# Patient Record
Sex: Male | Born: 1970
Health system: Southern US, Community
[De-identification: ages and names within clinical notes are randomized; demographics above are authoritative.]

---

## 2017-09-25 ENCOUNTER — Ambulatory Visit (INDEPENDENT_AMBULATORY_CARE_PROVIDER_SITE_OTHER): Payer: BLUE CROSS/BLUE SHIELD

## 2017-09-25 ENCOUNTER — Ambulatory Visit: Payer: BLUE CROSS/BLUE SHIELD | Admitting: Physician Assistant

## 2017-09-25 ENCOUNTER — Encounter: Payer: Self-pay | Admitting: Physician Assistant

## 2017-09-25 ENCOUNTER — Other Ambulatory Visit: Payer: Self-pay

## 2017-09-25 VITALS — BP 128/78 | HR 78 | Resp 16 | Ht 68.0 in | Wt 139.2 lb

## 2017-09-25 DIAGNOSIS — R0789 Other chest pain: Secondary | ICD-10-CM

## 2017-09-25 NOTE — Progress Notes (Signed)
09/25/2017 3:53 PM   DOB: 05/29/1971 / MRN: [id number]  SUBJECTIVE:  Raymond Reyes is a 47 y.o. male presenting for chest pressure.  He has had several episodes of this over the last few weeks and notes that the pressure is intermittent last roughly 1 minute and he describes it as a tightness.  Denies any shortness of breath, dizziness, diaphoresis, nausea, leg swelling, orthopnea.  He is a never smoker.  He does dip.  He drinks alcohol sparingly.  He was able to run 2 miles this morning and 20 minutes without any difficulty and states that this is normal for him.  Is lost 20 pounds in the last 6 months.  He and his wife are separated and he now has to care for his 2 young children every other week.  He works 65-75 hours a week and is about to quit his job in 2 weeks and plans to take some time away from working so he can reestablish his love for his children.  He sleeps well at night however tells me that at times he will wake up in a light sweat, he denies chest pain during these times, as well as diaphoresis, dizziness.  In the past he is also awoken with worry.  He has no personal history of heart disease, diabetes, hypertension.  He has no allergies on file.   He  has no past medical history on file.    He  reports that  has never smoked. His smokeless tobacco use includes chew. He reports that he drinks alcohol. He reports that he does not use drugs. He  has no sexual activity history on file. The patient  has no past surgical history on file.  His family history is not on file.  ROS  As per HPI otherwise negative.  The problem list and medications were reviewed and updated by myself where necessary and exist elsewhere in the encounter.   OBJECTIVE:  BP 128/78 (BP Location: Left Arm, Patient Position: Sitting, Cuff Size: Normal)   Pulse 78   Resp 16   Ht 5' 8"  (1.727 m)   Wt 139 lb 3.2 oz (63.1 kg)   SpO2 99%   BMI 21.17 kg/m   Physical Exam  Constitutional: He is oriented to  person, place, and time. He appears well-developed. He is active and cooperative.  Non-toxic appearance.  Eyes: EOM are normal. Pupils are equal, round, and reactive to light.  Cardiovascular: Normal rate, regular rhythm, S1 normal, S2 normal, normal heart sounds, intact distal pulses and normal pulses. Exam reveals no gallop and no friction rub.  No murmur heard. Pulmonary/Chest: Effort normal. No tachypnea. He has no rales.  Abdominal: He exhibits no distension.  Musculoskeletal: He exhibits no edema.  Neurological: He is alert and oriented to person, place, and time. He has normal strength and normal reflexes. He is not disoriented. No cranial nerve deficit or sensory deficit. He exhibits normal muscle tone. Coordination and gait normal.  Skin: Skin is warm and dry. He is not diaphoretic. No pallor.  Psychiatric: His behavior is normal.  Vitals reviewed.   No results found for this or any previous visit (from the past 72 hour(s)).  Dg Chest 2 View  Result Date: 09/25/2017 CLINICAL DATA:  Chest pressure.  Normal EKG. EXAM: CHEST  2 VIEW COMPARISON:  None. FINDINGS: The heart size and mediastinal contours are within normal limits. Both lungs are clear. The visualized skeletal structures are unremarkable. IMPRESSION: No active cardiopulmonary disease. Electronically  Signed   By: Staci Righter M.D.   On: 09/25/2017 15:04    ASSESSMENT AND PLAN:  Nicki was seen today for chest pain and weight loss.  Diagnoses and all orders for this visit:  Chest pressure: EKG and chest x-ray normal at this time.  He has no chest pain in the office.  I will calculate his risk for ASCVD after receiving his labs back.  Will refer appropriately.  We will see him back in about 1 month to discuss his progress. -     EKG 12-Lead -     Basic metabolic panel -     Hepatic function panel -     CBC -     Lipid panel -     TSH -     Hemoglobin A1c -     DG Chest 2 View; Future -     Cancel: Urinalysis Dipstick -      Urinalysis, dipstick only    The patient is advised to call or return to clinic if he does not see an improvement in symptoms, or to seek the care of the closest emergency department if he worsens with the above plan.   Philis Fendt, MHS, PA-C [facility] 09/25/2017 3:53 PM

## 2017-09-25 NOTE — Patient Instructions (Addendum)
It was a pleasure meeting you today, I'd like to see you in 1 month to check on your symptoms. Please call my office should any further problems arise.Thank you for allowing [place] to care for you.                                                                                                     Tereasa Coop, MS PA-C   IF you received an x-ray today, you will receive an invoice from [facility]. Please contact [facility] at [phone] with questions or concerns regarding your invoice.   IF you received labwork today, you will receive an invoice from [facility]. Please contact [facility] at [phone] with questions or concerns regarding your invoice.   Our billing staff will not be able to assist you with questions regarding bills from these companies.  You will be contacted with the lab results as soon as they are available. The fastest way to get your results is to activate your My Chart account. Instructions are located on the last page of this paperwork. If you have not heard from [place] regarding the results in 2 weeks, please contact this office.

## 2017-09-26 LAB — BASIC METABOLIC PANEL
BUN / CREAT RATIO: 12 (ref 9–20)
BUN: 11 mg/dL (ref 6–24)
CALCIUM: 9.9 mg/dL (ref 8.7–10.2)
CHLORIDE: 102 mmol/L (ref 96–106)
CO2: 24 mmol/L (ref 20–29)
Creatinine, Ser: 0.89 mg/dL (ref 0.76–1.27)
GFR calc Af Amer: 119 mL/min/{1.73_m2} (ref >59)
GFR calc non Af Amer: 103 mL/min/{1.73_m2} (ref >59)
GLUCOSE: 79 mg/dL (ref 65–99)
POTASSIUM: 4.1 mmol/L (ref 3.5–5.2)
Sodium: 143 mmol/L (ref 134–144)

## 2017-09-26 LAB — HEPATIC FUNCTION PANEL
ALT: 16 IU/L (ref 0–44)
AST: 15 IU/L (ref 0–40)
Albumin: 5.2 g/dL (ref 3.5–5.5)
Alkaline Phosphatase: 49 IU/L (ref 39–117)
Bilirubin Total: 0.4 mg/dL (ref 0.0–1.2)
Bilirubin, Direct: 0.12 mg/dL (ref 0.00–0.40)
TOTAL PROTEIN: 7.2 g/dL (ref 6.0–8.5)

## 2017-09-26 LAB — URINALYSIS, DIPSTICK ONLY
Bilirubin, UA: NEGATIVE
Glucose, UA: NEGATIVE
Ketones, UA: NEGATIVE
LEUKOCYTES UA: NEGATIVE
NITRITE UA: NEGATIVE
PROTEIN UA: NEGATIVE
RBC, UA: NEGATIVE
Specific Gravity, UA: 1.008 (ref 1.005–1.030)
Urobilinogen, Ur: 0.2 mg/dL (ref 0.2–1.0)
pH, UA: 6 (ref 5.0–7.5)

## 2017-09-26 LAB — LIPID PANEL
CHOL/HDL RATIO: 3.4 ratio (ref 0.0–5.0)
Cholesterol, Total: 192 mg/dL (ref 100–199)
HDL: 57 mg/dL (ref >39)
LDL CALC: 114 mg/dL — AB (ref 0–99)
TRIGLYCERIDES: 103 mg/dL (ref 0–149)
VLDL CHOLESTEROL CAL: 21 mg/dL (ref 5–40)

## 2017-09-26 LAB — CBC
HEMATOCRIT: 43.2 % (ref 37.5–51.0)
HEMOGLOBIN: 14.2 g/dL (ref 13.0–17.7)
MCH: 30.5 pg (ref 26.6–33.0)
MCHC: 32.9 g/dL (ref 31.5–35.7)
MCV: 93 fL (ref 79–97)
Platelets: 340 10*3/uL (ref 150–379)
RBC: 4.66 x10E6/uL (ref 4.14–5.80)
RDW: 14.2 % (ref 12.3–15.4)
WBC: 5.5 10*3/uL (ref 3.4–10.8)

## 2017-09-26 LAB — HEMOGLOBIN A1C
Est. average glucose Bld gHb Est-mCnc: 105 mg/dL
Hgb A1c MFr Bld: 5.3 % (ref 4.8–5.6)

## 2017-09-26 LAB — TSH: TSH: 1.88 u[IU]/mL (ref 0.450–4.500)

## 2017-10-25 ENCOUNTER — Other Ambulatory Visit: Payer: Self-pay

## 2017-10-25 ENCOUNTER — Encounter: Payer: Self-pay | Admitting: Physician Assistant

## 2017-10-25 ENCOUNTER — Ambulatory Visit: Payer: BLUE CROSS/BLUE SHIELD | Admitting: Physician Assistant

## 2017-10-25 VITALS — BP 113/65 | HR 60 | Temp 98.0°F | Resp 16 | Ht 68.0 in | Wt 140.4 lb

## 2017-10-25 DIAGNOSIS — Z3009 Encounter for other general counseling and advice on contraception: Secondary | ICD-10-CM | POA: Diagnosis not present

## 2017-10-25 DIAGNOSIS — R0789 Other chest pain: Secondary | ICD-10-CM

## 2017-10-25 NOTE — Progress Notes (Signed)
    10/25/2017 11:22 AM   DOB: 05-12-1971 / MRN: [id number]  SUBJECTIVE:  Raymond Reyes is a 47 y.o. male presenting for recheck of chest pressure.  His EKG and chest x-ray were negative in the office.  He has an exquisitely low risk of ASCVD.  He has made some changes from a stress standpoint and tells me that the chest pressure has now resolved.  He feels well and denies complaint in this regard.  He would like to see a urologist regarding the potential for vasectomy. He has No Known Allergies.   He  has no past medical history on file.    He  reports that  has never smoked. His smokeless tobacco use includes chew. He reports that he drinks alcohol. He reports that he does not use drugs. He  has no sexual activity history on file. The patient  has no past surgical history on file.  His family history is not on file.  Review of Systems  Constitutional: Negative for chills, diaphoresis and fever.  Eyes: Negative.   Respiratory: Negative for cough, hemoptysis, sputum production, shortness of breath and wheezing.   Cardiovascular: Negative for chest pain, orthopnea and leg swelling.  Gastrointestinal: Negative for abdominal pain, blood in stool, constipation, diarrhea, heartburn, melena, nausea and vomiting.  Genitourinary: Negative for dysuria, flank pain, frequency, hematuria and urgency.  Skin: Negative for rash.  Neurological: Negative for dizziness, sensory change, speech change, focal weakness and headaches.    The problem list and medications were reviewed and updated by myself where necessary and exist elsewhere in the encounter.   OBJECTIVE:  BP 113/65   Pulse 60   Temp 98 F (36.7 C) (Oral)   Resp 16   Ht 5' 8"  (1.727 m)   Wt 140 lb 6.4 oz (63.7 kg)   SpO2 100%   BMI 21.35 kg/m     The 10-year ASCVD risk score Mikey Bussing DC Jr., et al., 2013) is: 1.3%   Values used to calculate the score:     Age: 28 years     Sex: Male     Is Non-Hispanic African American: No  Diabetic: No     Tobacco smoker: No     Systolic Blood Pressure: 742 mmHg     Is BP treated: No     HDL Cholesterol: 57 mg/dL     Total Cholesterol: 192 mg/dL   Physical Exam  Constitutional: He appears well-developed. He is active and cooperative.  Non-toxic appearance.  Cardiovascular: Normal rate.  Pulmonary/Chest: Effort normal. No tachypnea.  Neurological: He is alert.  Skin: Skin is warm and dry. He is not diaphoretic. No pallor.  Vitals reviewed.   No results found for this or any previous visit (from the past 72 hour(s)).  No results found.  ASSESSMENT AND PLAN:  Raymond Reyes was seen today for anxiety and stress.  Diagnoses and all orders for this visit:  Chest pressure Comments: Resolved.  He can come back as needed for this.  Vasectomy evaluation -     Ambulatory referral to Urology    The patient is advised to call or return to clinic if he does not see an improvement in symptoms, or to seek the care of the closest emergency department if he worsens with the above plan.   Philis Fendt, MHS, PA-C [facility] 10/25/2017 11:22 AM

## 2017-10-25 NOTE — Patient Instructions (Signed)
     IF you received an x-ray today, you will receive an invoice from [facility]. Please contact [facility] at [phone] with questions or concerns regarding your invoice.   IF you received labwork today, you will receive an invoice from [facility]. Please contact [facility] at [phone] with questions or concerns regarding your invoice.   Our billing staff will not be able to assist you with questions regarding bills from these companies.  You will be contacted with the lab results as soon as they are available. The fastest way to get your results is to activate your My Chart account. Instructions are located on the last page of this paperwork. If you have not heard from us regarding the results in 2 weeks, please contact this office.     

## 2017-12-03 DIAGNOSIS — S86002D Unspecified injury of left Achilles tendon, subsequent encounter: Secondary | ICD-10-CM | POA: Diagnosis not present

## 2017-12-03 DIAGNOSIS — S86002A Unspecified injury of left Achilles tendon, initial encounter: Secondary | ICD-10-CM | POA: Diagnosis not present

## 2017-12-04 ENCOUNTER — Ambulatory Visit
Admission: RE | Admit: 2017-12-04 | Discharge: 2017-12-04 | Disposition: A | Discharge status: Home / Self Care | Payer: Self-pay | Source: Ambulatory Visit | Attending: Orthopedic Surgery | Admitting: Orthopedic Surgery

## 2017-12-04 ENCOUNTER — Other Ambulatory Visit: Payer: Self-pay | Admitting: Orthopedic Surgery

## 2017-12-04 DIAGNOSIS — M7661 Achilles tendinitis, right leg: Secondary | ICD-10-CM | POA: Diagnosis not present

## 2017-12-04 DIAGNOSIS — M7662 Achilles tendinitis, left leg: Secondary | ICD-10-CM

## 2017-12-16 DIAGNOSIS — S86012D Strain of left Achilles tendon, subsequent encounter: Secondary | ICD-10-CM | POA: Diagnosis not present

## 2017-12-16 DIAGNOSIS — S86002D Unspecified injury of left Achilles tendon, subsequent encounter: Secondary | ICD-10-CM | POA: Diagnosis not present

## 2017-12-20 DIAGNOSIS — S86002D Unspecified injury of left Achilles tendon, subsequent encounter: Secondary | ICD-10-CM | POA: Diagnosis not present

## 2017-12-20 DIAGNOSIS — S86012D Strain of left Achilles tendon, subsequent encounter: Secondary | ICD-10-CM | POA: Diagnosis not present

## 2017-12-23 DIAGNOSIS — S86012D Strain of left Achilles tendon, subsequent encounter: Secondary | ICD-10-CM | POA: Diagnosis not present

## 2017-12-23 DIAGNOSIS — S86002D Unspecified injury of left Achilles tendon, subsequent encounter: Secondary | ICD-10-CM | POA: Diagnosis not present

## 2017-12-27 DIAGNOSIS — S86002D Unspecified injury of left Achilles tendon, subsequent encounter: Secondary | ICD-10-CM | POA: Diagnosis not present

## 2017-12-27 DIAGNOSIS — S86012D Strain of left Achilles tendon, subsequent encounter: Secondary | ICD-10-CM | POA: Diagnosis not present

## 2017-12-30 DIAGNOSIS — S86012D Strain of left Achilles tendon, subsequent encounter: Secondary | ICD-10-CM | POA: Diagnosis not present

## 2017-12-30 DIAGNOSIS — S86002D Unspecified injury of left Achilles tendon, subsequent encounter: Secondary | ICD-10-CM | POA: Diagnosis not present

## 2018-01-01 DIAGNOSIS — S86012D Strain of left Achilles tendon, subsequent encounter: Secondary | ICD-10-CM | POA: Diagnosis not present

## 2018-01-01 DIAGNOSIS — S86002D Unspecified injury of left Achilles tendon, subsequent encounter: Secondary | ICD-10-CM | POA: Diagnosis not present

## 2018-01-06 DIAGNOSIS — S86002D Unspecified injury of left Achilles tendon, subsequent encounter: Secondary | ICD-10-CM | POA: Diagnosis not present

## 2018-01-06 DIAGNOSIS — S86012D Strain of left Achilles tendon, subsequent encounter: Secondary | ICD-10-CM | POA: Diagnosis not present

## 2018-01-08 DIAGNOSIS — S86002D Unspecified injury of left Achilles tendon, subsequent encounter: Secondary | ICD-10-CM | POA: Diagnosis not present

## 2018-01-08 DIAGNOSIS — S86012D Strain of left Achilles tendon, subsequent encounter: Secondary | ICD-10-CM | POA: Diagnosis not present

## 2018-01-16 DIAGNOSIS — S86012D Strain of left Achilles tendon, subsequent encounter: Secondary | ICD-10-CM | POA: Diagnosis not present

## 2018-01-16 DIAGNOSIS — S86002D Unspecified injury of left Achilles tendon, subsequent encounter: Secondary | ICD-10-CM | POA: Diagnosis not present

## 2018-01-28 DIAGNOSIS — S86012D Strain of left Achilles tendon, subsequent encounter: Secondary | ICD-10-CM | POA: Diagnosis not present

## 2018-01-28 DIAGNOSIS — S86002D Unspecified injury of left Achilles tendon, subsequent encounter: Secondary | ICD-10-CM | POA: Diagnosis not present

## 2018-01-31 ENCOUNTER — Encounter: Payer: Self-pay | Admitting: Physician Assistant

## 2018-01-31 ENCOUNTER — Ambulatory Visit: Payer: BLUE CROSS/BLUE SHIELD | Admitting: Physician Assistant

## 2018-01-31 ENCOUNTER — Other Ambulatory Visit: Payer: Self-pay

## 2018-01-31 VITALS — BP 102/60 | HR 74 | Temp 98.8°F | Resp 18 | Ht 68.0 in | Wt 138.0 lb

## 2018-01-31 DIAGNOSIS — J069 Acute upper respiratory infection, unspecified: Secondary | ICD-10-CM

## 2018-01-31 NOTE — Progress Notes (Signed)
Raymond Reyes  MRN: [id number] DOB: Dec 27, 1970  PCP: Patient, No Pcp Per  Chief Complaint  Patient presents with  . Cough    x2weeks stuffy nose is kind of gone but cant seem to get rid of cough     Subjective:  Pt presents to clinic for 2 week h/o cold symptoms.  Most the symptoms are resolved except for the cough.  His son had similar symptoms but he is completely better.  Over the last week the patient has seen improvement in his symptoms but he wants to make sure he should not be doing something else.  He has been using cold preps that help symptoms.   History is obtained by patient.  Review of Systems  Constitutional: Negative for chills and fever.  HENT: Negative for congestion, rhinorrhea, sinus pressure, sinus pain and sore throat.   Respiratory: Positive for cough (dry). Negative for shortness of breath and wheezing.        Nonsmoker No h/o asthma  Gastrointestinal: Negative.   Neurological: Negative for dizziness and headaches.    There are no active problems to display for this patient.   No current outpatient medications on file prior to visit.   No current facility-administered medications on file prior to visit.     No Known Allergies  History reviewed. No pertinent past medical history. Social History   Social History Narrative  . Not on file   Social History   Tobacco Use  . Smoking status: Never Smoker  . Smokeless tobacco: Current User    Types: Chew  Substance Use Topics  . Alcohol use: Yes    Comment: occasional  . Drug use: No   family history is not on file.     Objective:  BP 102/60   Pulse 74   Temp 98.8 F (37.1 C) (Oral)   Resp 18   Ht 5' 8"  (1.727 m)   Wt 138 lb (62.6 kg)   SpO2 97%   BMI 20.98 kg/m  Body mass index is 20.98 kg/m.  Wt Readings from Last 3 Encounters:  01/31/18 138 lb (62.6 kg)  10/25/17 140 lb 6.4 oz (63.7 kg)  09/25/17 139 lb 3.2 oz (63.1 kg)    Physical Exam  Constitutional: He is oriented to  person, place, and time. He appears well-developed and well-nourished.  HENT:  Head: Normocephalic and atraumatic.  Right Ear: Hearing, tympanic membrane, external ear and ear canal normal.  Left Ear: Hearing, tympanic membrane, external ear and ear canal normal.  Nose: Nose normal.  Mouth/Throat: Uvula is midline, oropharynx is clear and moist and mucous membranes are normal.  Eyes: Conjunctivae are normal.  Neck: Normal range of motion.  Cardiovascular: Normal rate, regular rhythm and normal heart sounds.  Pulmonary/Chest: Effort normal and breath sounds normal. He has no wheezes.  Lymphadenopathy:       Head (right side): No tonsillar adenopathy present.       Head (left side): No tonsillar adenopathy present.    He has no cervical adenopathy.       Right: No supraclavicular adenopathy present.       Left: No supraclavicular adenopathy present.  Neurological: He is alert and oriented to person, place, and time.  Skin: Skin is warm and dry.  Psychiatric: He has a normal mood and affect. His behavior is normal. Judgment and thought content normal.  Vitals reviewed.   Assessment and Plan :  URI with cough and congestion Continue symptomatic treatment - if not almost  well in a week contact me for likely abx treatment  Windell Hummingbird PA-C  [facility] 01/31/2018 4:53 PM

## 2018-01-31 NOTE — Patient Instructions (Addendum)
  Please push fluids.  Tylenol and Motrin for fever and body aches.      Nasal saline spray can be helpful to keep the mucus membranes moist and thin the nasal mucus    IF you received an x-ray today, you will receive an invoice from [facility]. Please contact [facility] at [phone] with questions or concerns regarding your invoice.   IF you received labwork today, you will receive an invoice from [facility]. Please contact [facility] at [phone] with questions or concerns regarding your invoice.   Our billing staff will not be able to assist you with questions regarding bills from these companies.  You will be contacted with the lab results as soon as they are available. The fastest way to get your results is to activate your My Chart account. Instructions are located on the last page of this paperwork. If you have not heard from [place] regarding the results in 2 weeks, please contact this office.

## 2018-02-04 DIAGNOSIS — S86002D Unspecified injury of left Achilles tendon, subsequent encounter: Secondary | ICD-10-CM | POA: Diagnosis not present

## 2018-02-04 DIAGNOSIS — S86012D Strain of left Achilles tendon, subsequent encounter: Secondary | ICD-10-CM | POA: Diagnosis not present

## 2018-06-25 DIAGNOSIS — F4323 Adjustment disorder with mixed anxiety and depressed mood: Secondary | ICD-10-CM | POA: Diagnosis not present

## 2018-07-02 DIAGNOSIS — F4323 Adjustment disorder with mixed anxiety and depressed mood: Secondary | ICD-10-CM | POA: Diagnosis not present

## 2018-07-09 DIAGNOSIS — F4323 Adjustment disorder with mixed anxiety and depressed mood: Secondary | ICD-10-CM | POA: Diagnosis not present

## 2018-07-30 DIAGNOSIS — F4323 Adjustment disorder with mixed anxiety and depressed mood: Secondary | ICD-10-CM | POA: Diagnosis not present

## 2018-11-05 ENCOUNTER — Encounter: Payer: BLUE CROSS/BLUE SHIELD | Admitting: Physician Assistant

## 2019-10-23 ENCOUNTER — Ambulatory Visit: Payer: Self-pay | Attending: Internal Medicine

## 2019-10-23 DIAGNOSIS — Z23 Encounter for immunization: Secondary | ICD-10-CM

## 2019-10-23 NOTE — Progress Notes (Signed)
   Covid-19 Vaccination Clinic  Name:  Raymond Reyes    MRN: [id number] DOB: 11-05-1970  10/23/2019  [name] was observed post Covid-19 immunization for 15 minutes without incident. He was provided with Vaccine Information Sheet and instruction to access the V-Safe system.   [name] was instructed to call 911 with any severe reactions post vaccine: Marland Kitchen Difficulty breathing  . Swelling of face and throat  . A fast heartbeat  . A bad rash all over body  . Dizziness and weakness

## 2019-11-25 ENCOUNTER — Ambulatory Visit: Payer: Self-pay | Attending: Internal Medicine

## 2019-11-25 DIAGNOSIS — Z23 Encounter for immunization: Secondary | ICD-10-CM

## 2019-11-25 NOTE — Progress Notes (Signed)
   Covid-19 Vaccination Clinic  Name:  Jaceion Aday    MRN: [id number] DOB: Apr 19, 1971  11/25/2019  Mr. Hewes was observed post Covid-19 immunization for 15 minutes without incident. He was provided with Vaccine Information Sheet and instruction to access the V-Safe system.   Mr. Kitson was instructed to call 911 with any severe reactions post vaccine: Marland Kitchen Difficulty breathing  . Swelling of face and throat  . A fast heartbeat  . A bad rash all over body  . Dizziness and weakness   Immunizations Administered    Name Date Dose VIS Date Route   Pfizer COVID-19 Vaccine 11/25/2019  8:52 AM 0.3 mL 07/31/2019 Intramuscular   Manufacturer: [place]   Lot: [id number]   [place]: [id number]

## 2019-11-26 IMAGING — MR MR ANKLE*L* W/O CM
4 of 5 series · 26 of 40 positions shown · non-contrast
Comparison: None.

CLINICAL DATA: Achilles tendon pain for the past 3 days after
tennis injury.

EXAM:
MRI OF THE LEFT ANKLE WITHOUT CONTRAST
TECHNIQUE: Multiplanar, multisequence MR imaging of the ankle was performed. No
intravenous contrast was administered.

[Series 3: T1 · sagittal · 3.0mm · 0.28mm/px · 6 of 24 slices shown (1 of 2)]
[im 1/24]
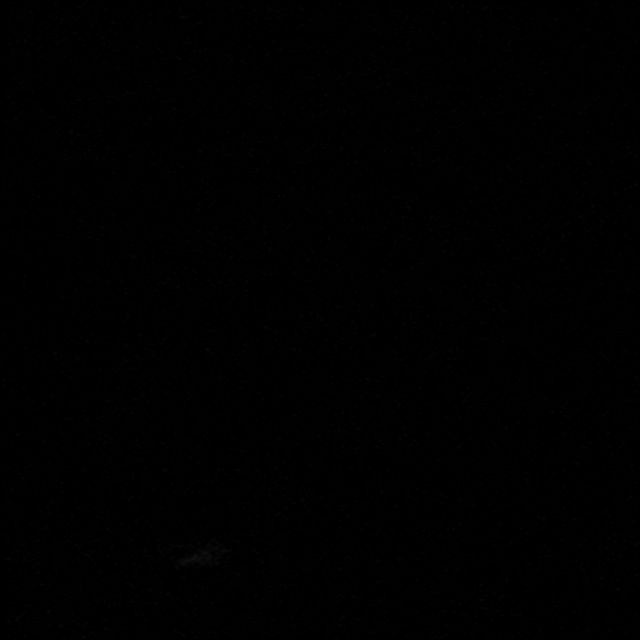
[im 5/24]
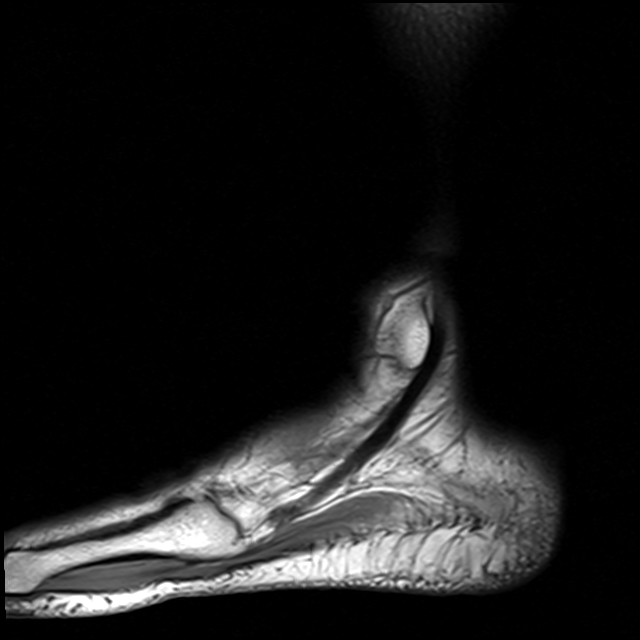
[im 10/24]
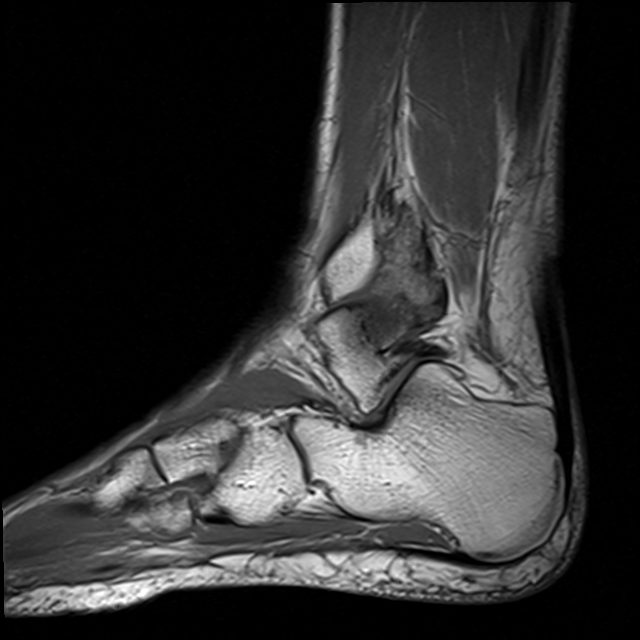
[im 14/24]
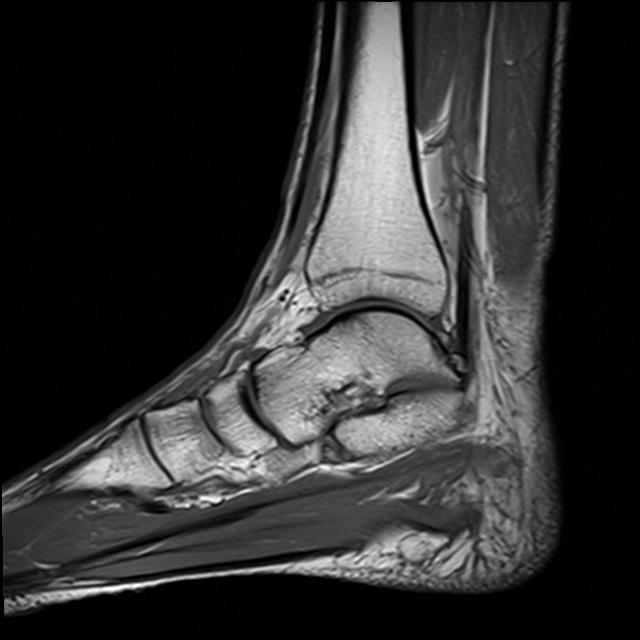
[im 19/24]
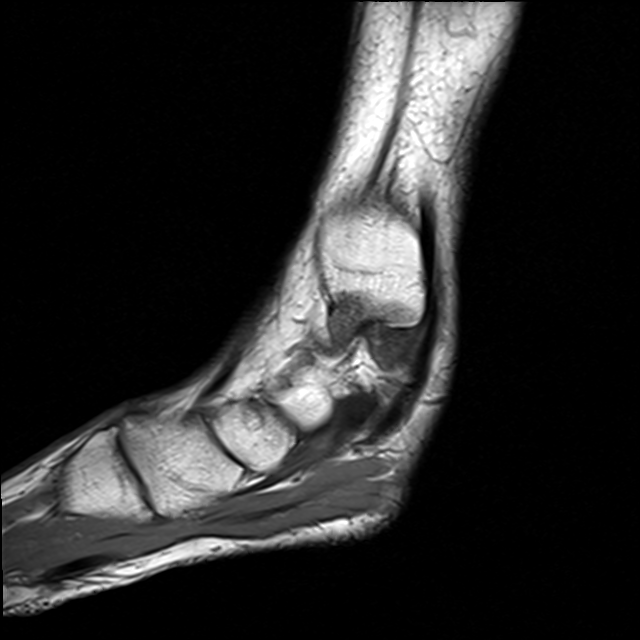
[im 24/24]
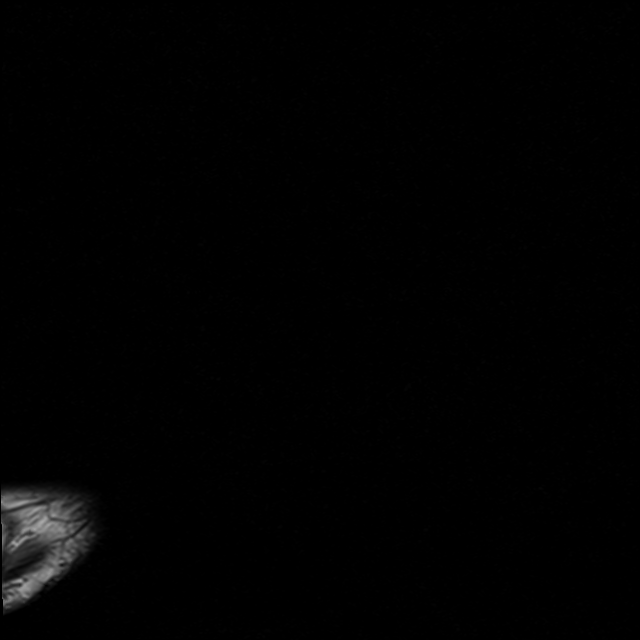

[Series 5: T2 fat-sat · axial · 4.0mm · 0.70mm/px · z∈[-96,+71]mm · 8 of 36 slices shown (1 of 2)]
[im 1/36]
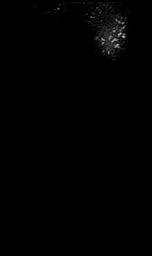
[im 4/36]
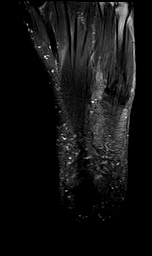
[im 12/36]
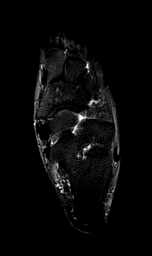
[im 16/36]
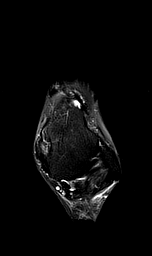
[im 20/36]
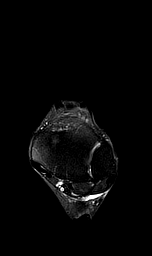
[im 24/36]
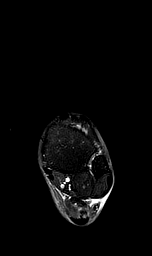
[im 32/36]
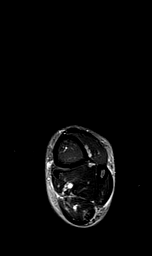
[im 36/36]
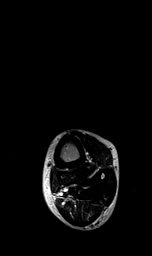

[Series 6: T1 · axial · 4.0mm · 0.28mm/px · z∈[-96,+52]mm · 4 of 36 slices shown (2 of 2)]
[im 1/36]
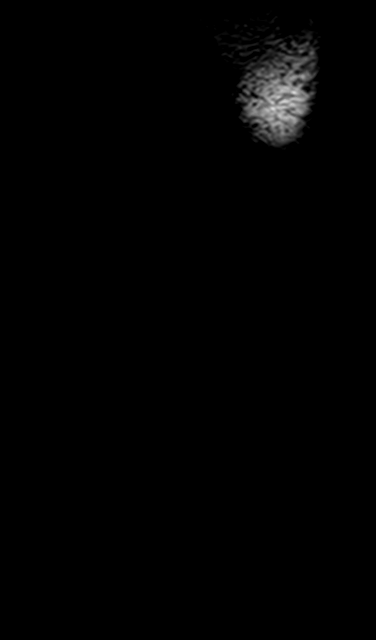
[im 4/36]
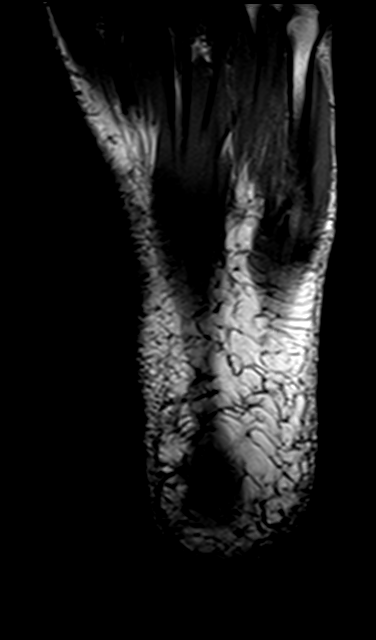
[im 20/36]
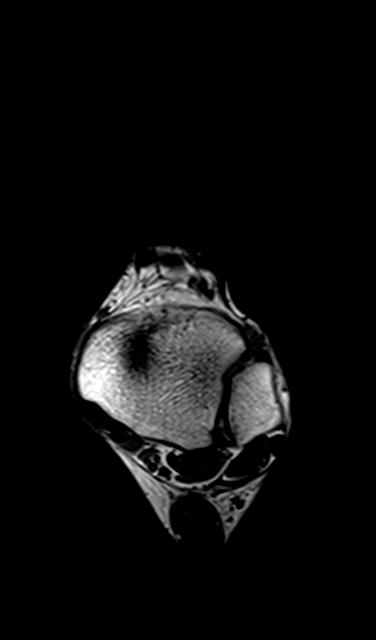
[im 32/36]
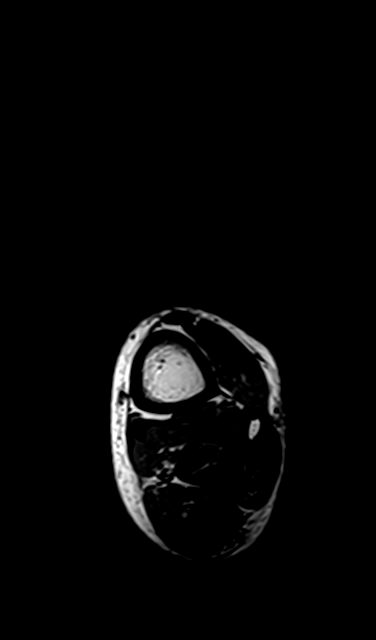

[Series 7: T2 fat-sat · coronal · 3.5mm · 0.35mm/px · 8 of 30 slices shown (2 of 2)]
[im 1/30]
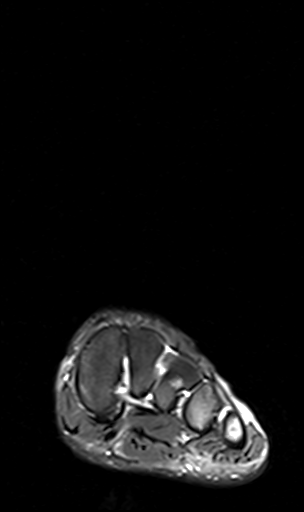
[im 5/30]
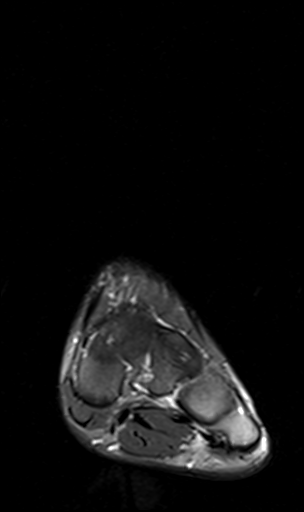
[im 9/30]
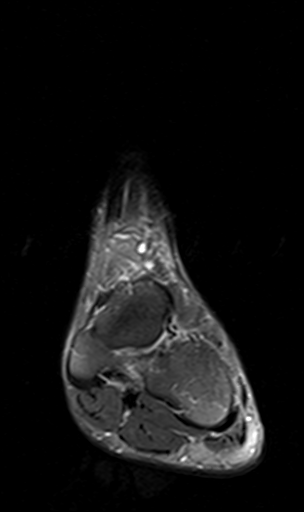
[im 13/30]
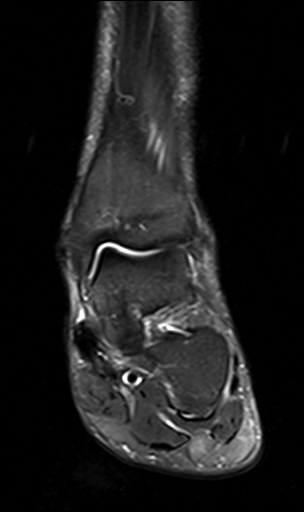
[im 17/30]
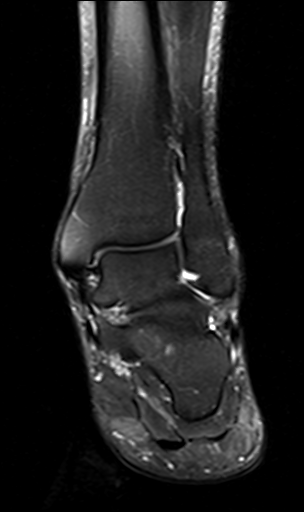
[im 21/30]
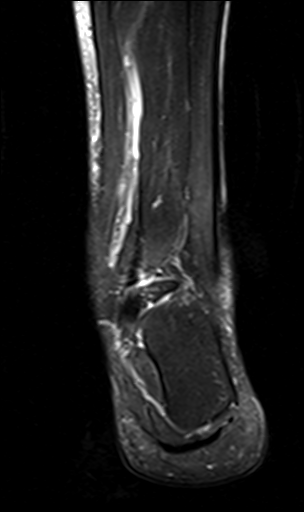
[im 25/30]
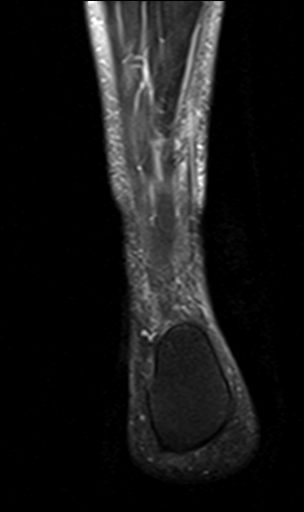
[im 30/30]
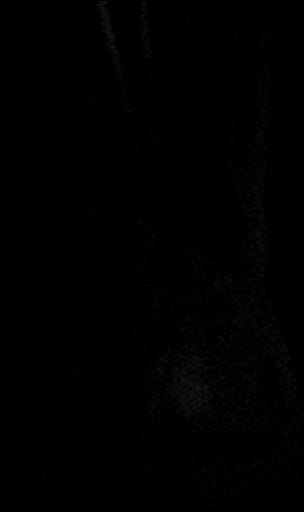

[26 of 40 positions shown; findings below may reference images not displayed]

FINDINGS: TENDONS

Peroneal: Peroneal longus tendon intact. Peroneal brevis intact.

Posteromedial: Posterior tibial tendon intact. Flexor hallucis
longus tendon intact. Flexor digitorum longus tendon intact. Small
amount of fluid in the posterior tibialis and flexor digitorum
longus tendon sheaths.

Anterior: Tibialis anterior tendon intact. Extensor hallucis longus
tendon intact Extensor digitorum longus tendon intact.

Achilles: Thickening and partial tear of the medial aspect of the
proximal Achilles tendon at the myotendinous junction with
surrounding edema. No full-thickness tear.

Plantar Fascia: Intact.

LIGAMENTS

Lateral: Anterior talofibular ligament intact. Calcaneofibular
ligament intact. Posterior talofibular ligament intact. Anterior and
posterior tibiofibular ligaments intact.

Medial: Deltoid ligament intact. Spring ligament intact. Lisfranc
ligament intact.

CARTILAGE

Ankle Joint: No joint effusion. Normal ankle mortise. No chondral
defect.

Subtalar Joints/Sinus Tarsi: Normal subtalar joints. No subtalar
joint effusion. Normal sinus tarsi.

Bones: Mild osteoarthritis of the talonavicular joint with
subchondral cystic change in the navicular. No fracture or
dislocation. Os trigonum.

Soft Tissue: Small amount of soft tissue edema along lateral
hindfoot.
IMPRESSION: 1. Partial tear of the medial aspect of the proximal Achilles tendon
at the myotendinous junction. No full-thickness tear.
2. Mild talonavicular osteoarthritis.

## 2021-04-17 ENCOUNTER — Other Ambulatory Visit: Payer: Self-pay | Admitting: Family Medicine

## 2021-04-17 DIAGNOSIS — M545 Low back pain, unspecified: Secondary | ICD-10-CM

## 2021-04-17 DIAGNOSIS — G8929 Other chronic pain: Secondary | ICD-10-CM

## 2021-05-08 ENCOUNTER — Other Ambulatory Visit: Payer: Self-pay
